# Patient Record
Sex: Male | Born: 1975 | Race: White | Hispanic: No | Marital: Married
Health system: Southern US, Community
[De-identification: ages and names within clinical notes are randomized; demographics above are authoritative.]

## PROBLEM LIST (undated history)

## (undated) DIAGNOSIS — Z789 Other specified health status: Secondary | ICD-10-CM

---

## 2018-03-07 ENCOUNTER — Other Ambulatory Visit: Payer: Self-pay | Admitting: Family Medicine

## 2018-03-07 DIAGNOSIS — R1011 Right upper quadrant pain: Secondary | ICD-10-CM

## 2018-03-14 ENCOUNTER — Ambulatory Visit
Admission: RE | Admit: 2018-03-14 | Discharge: 2018-03-14 | Disposition: A | Discharge status: Home / Self Care | Payer: BC Managed Care – PPO | Source: Ambulatory Visit | Attending: Family Medicine | Admitting: Family Medicine

## 2018-03-14 DIAGNOSIS — R1011 Right upper quadrant pain: Secondary | ICD-10-CM | POA: Insufficient documentation

## 2018-05-16 ENCOUNTER — Ambulatory Visit: Payer: BC Managed Care – PPO | Admitting: Anesthesiology

## 2018-05-16 ENCOUNTER — Ambulatory Visit
Admission: RE | Admit: 2018-05-16 | Discharge: 2018-05-16 | Disposition: A | Discharge status: Home / Self Care | Payer: BC Managed Care – PPO | Source: Ambulatory Visit | Attending: Gastroenterology | Admitting: Gastroenterology

## 2018-05-16 ENCOUNTER — Encounter
Admission: RE | Disposition: A | Discharge status: Home / Self Care | Payer: Self-pay | Source: Ambulatory Visit | Attending: Gastroenterology

## 2018-05-16 ENCOUNTER — Encounter: Payer: Self-pay | Admitting: Anesthesiology

## 2018-05-16 DIAGNOSIS — Z79899 Other long term (current) drug therapy: Secondary | ICD-10-CM | POA: Insufficient documentation

## 2018-05-16 DIAGNOSIS — K2980 Duodenitis without bleeding: Secondary | ICD-10-CM | POA: Insufficient documentation

## 2018-05-16 DIAGNOSIS — K2960 Other gastritis without bleeding: Secondary | ICD-10-CM | POA: Insufficient documentation

## 2018-05-16 DIAGNOSIS — K2210 Ulcer of esophagus without bleeding: Secondary | ICD-10-CM | POA: Diagnosis not present

## 2018-05-16 DIAGNOSIS — K219 Gastro-esophageal reflux disease without esophagitis: Secondary | ICD-10-CM | POA: Insufficient documentation

## 2018-05-16 DIAGNOSIS — K449 Diaphragmatic hernia without obstruction or gangrene: Secondary | ICD-10-CM | POA: Diagnosis not present

## 2018-05-16 DIAGNOSIS — R1011 Right upper quadrant pain: Secondary | ICD-10-CM | POA: Diagnosis present

## 2018-05-16 HISTORY — PX: ESOPHAGOGASTRODUODENOSCOPY (EGD) WITH PROPOFOL: SHX5813

## 2018-05-16 SURGERY — ESOPHAGOGASTRODUODENOSCOPY (EGD) WITH PROPOFOL
Anesthesia: General

## 2018-05-16 MED ORDER — PROPOFOL 500 MG/50ML IV EMUL
INTRAVENOUS | Status: AC
  Filled 2018-05-16: qty 50

## 2018-05-16 MED ORDER — PROPOFOL 500 MG/50ML IV EMUL
INTRAVENOUS | Status: DC | PRN
  Administered 2018-05-16: 120 ug/kg/min via INTRAVENOUS

## 2018-05-16 MED ORDER — MIDAZOLAM HCL 2 MG/2ML IJ SOLN
INTRAMUSCULAR | Status: DC | PRN
  Administered 2018-05-16: 2 mg via INTRAVENOUS

## 2018-05-16 MED ORDER — GLYCOPYRROLATE 0.2 MG/ML IJ SOLN
INTRAMUSCULAR | Status: AC
  Filled 2018-05-16: qty 1

## 2018-05-16 MED ORDER — LIDOCAINE HCL (CARDIAC) PF 100 MG/5ML IV SOSY
PREFILLED_SYRINGE | INTRAVENOUS | Status: DC | PRN
  Administered 2018-05-16: 30 mg via INTRAVENOUS

## 2018-05-16 MED ORDER — LIDOCAINE HCL (PF) 1 % IJ SOLN
2.0000 mL | Freq: Once | INTRAMUSCULAR | Status: AC
  Administered 2018-05-16: 0.3 mL via INTRADERMAL

## 2018-05-16 MED ORDER — SODIUM CHLORIDE 0.9 % IV SOLN
INTRAVENOUS | Status: DC
  Administered 2018-05-16: 1000 mL via INTRAVENOUS

## 2018-05-16 MED ORDER — MIDAZOLAM HCL 2 MG/2ML IJ SOLN
INTRAMUSCULAR | Status: AC
  Filled 2018-05-16: qty 2

## 2018-05-16 MED ORDER — GLYCOPYRROLATE 0.2 MG/ML IJ SOLN
INTRAMUSCULAR | Status: DC | PRN
  Administered 2018-05-16: 0.1 mg via INTRAVENOUS

## 2018-05-16 MED ORDER — LIDOCAINE HCL (PF) 2 % IJ SOLN
INTRAMUSCULAR | Status: AC
  Filled 2018-05-16: qty 10

## 2018-05-16 MED ORDER — SODIUM CHLORIDE 0.9 % IV SOLN: INTRAVENOUS | Status: DC

## 2018-05-16 MED ORDER — LIDOCAINE HCL (PF) 1 % IJ SOLN
INTRAMUSCULAR | Status: AC
  Administered 2018-05-16: 0.3 mL via INTRADERMAL
  Filled 2018-05-16: qty 2

## 2018-05-16 MED ORDER — FENTANYL CITRATE (PF) 100 MCG/2ML IJ SOLN
INTRAMUSCULAR | Status: DC | PRN
  Administered 2018-05-16: 25 ug via INTRAVENOUS
  Administered 2018-05-16: 50 ug via INTRAVENOUS
  Administered 2018-05-16: 25 ug via INTRAVENOUS

## 2018-05-16 MED ORDER — FENTANYL CITRATE (PF) 100 MCG/2ML IJ SOLN
INTRAMUSCULAR | Status: AC
  Filled 2018-05-16: qty 2

## 2018-05-16 NOTE — Anesthesia Post-op Follow-up Note (Signed)
Anesthesia QCDR form completed.        

## 2018-05-16 NOTE — Anesthesia Procedure Notes (Signed)
Performed by: Cook-Martin, Accalia Rigdon Pre-anesthesia Checklist: Patient identified, Emergency Drugs available, Suction available, Patient being monitored and Timeout performed Patient Re-evaluated:Patient Re-evaluated prior to induction Oxygen Delivery Method: Nasal cannula Preoxygenation: Pre-oxygenation with 100% oxygen Induction Type: IV induction Placement Confirmation: positive ETCO2 and CO2 detector       

## 2018-05-16 NOTE — H&P (Signed)
Outpatient short stay form Pre-procedure 05/16/2018 9:45 AM [name] MD  Primary Physician: Dr. Thereasa Quinn  Reason for visit: EGD  History of present illness: Patient is a 42 year old male presenting today for an EGD in regards to right upper quadrant pain.  This is been occurring near daily for at least a year or so.  Has been started on a proton pump inhibitor about 4 weeks or so ago.  He states that seems to help but he takes it irregularly.  No nausea or vomiting with this.  There does not seem to be any radiation.  It does not seem to be a trigger food.  Seen no blood in the stool.  He does at times have diarrhea following a meal. Has had a abdominal ultrasound that was normal has not had a HIDA CCK study.   Current Facility-Administered Medications:  .  0.9 %  sodium chloride infusion, , Intravenous, Continuous, [name], MD, Last Rate: 20 mL/hr at 05/16/18 0916, 1,000 mL at 05/16/18 0916 .  0.9 %  sodium chloride infusion, , Intravenous, Continuous, [name], MD  Medications Prior to Admission  Medication Sig Dispense Refill Last Dose  . pantoprazole (PROTONIX) 40 MG tablet Take 40 mg by mouth daily.        No Known Allergies   History reviewed. No pertinent past medical history.  Review of systems:      Physical Exam    Heart and lungs: Regular rate and rhythm without rub or gallop, lungs are bilaterally clear.    HEENT: Cephalic atraumatic eyes are anicteric    Other:    Pertinant exam for procedure: Soft nontender nondistended bowel sounds positive normoactive    Planned proceedures: EGD and indicated procedures. I have discussed the risks benefits and complications of procedures to include not limited to bleeding, infection, perforation and the risk of sedation and the patient wishes to proceed.    [name], MD Gastroenterology 05/16/2018  9:45 AM

## 2018-05-16 NOTE — Op Note (Addendum)
[facility] Gastroenterology Patient Name: Jimmy Quinn Procedure Date: 05/16/2018 9:35 AM MRN: [id number] Account #: [id number] Date of Birth: 1975/11/26 Admit Type: Outpatient Age: 42 Room: [facility] ENDO ROOM 3 Gender: Male Note Status: Finalized Procedure:            Upper GI endoscopy Indications:          Abdominal pain in the right upper quadrant Providers:            [name], MD Medicines:            Monitored Anesthesia Care Complications:        No immediate complications. Procedure:            Pre-Anesthesia Assessment:                       - ASA Grade Assessment: II - A patient with mild                        systemic disease.                       After obtaining informed consent, the endoscope was                        passed under direct vision. Throughout the procedure,                        the patient's blood pressure, pulse, and oxygen                        saturations were monitored continuously. The Endoscope                        was introduced through the mouth, and advanced to the                        third part of duodenum. The upper GI endoscopy was                        accomplished without difficulty. The patient tolerated                        the procedure well. Findings:      LA Grade B (one or more mucosal breaks greater than 5 mm, not extending       between the tops of two mucosal folds) esophagitis with no bleeding was       found. Biopsies were taken with a cold forceps for histology.      Patchy mild inflammation characterized by congestion (edema), erosions       and erythema was found in the gastric body and in the gastric antrum.       Biopsies were taken with a cold forceps for histology. Biopsies were       taken with a cold forceps for Helicobacter pylori testing.      A small hiatal hernia was found. The Z-line was a variable distance from       incisors; the hiatal hernia was sliding.      Moderate  inflammation characterized by congestion (edema), erosions,       erythema and granularity was found in the duodenal bulb. Biopsies were  taken with a cold forceps for histology.      The exam of the duodenum was otherwise normal.      The cardia and gastric fundus were normal on retroflexion otherwise. Impression:           - LA Grade B erosive esophagitis. Biopsied.                       - Erosive gastritis. Biopsied.                       - Small hiatal hernia.                       - Erosive duodenitis. Biopsied. Recommendation:       - Discharge patient to home.                       - Use Protonix (pantoprazole) 40 mg PO BID for 6 weeks.                       - Use Protonix (pantoprazole) 40 mg PO daily daily.                       - Perform a HIDA (hepatobiliary iminodiacetic acid)                        scan with CCK stimulation at appointment to be                        scheduled. Procedure Code(s):    --- Professional ---                       [phone], Esophagogastroduodenoscopy, flexible, transoral;                        with biopsy, single or multiple Diagnosis Code(s):    --- Professional ---                       K20.8, Other esophagitis                       K29.60, Other gastritis without bleeding                       K44.9, Diaphragmatic hernia without obstruction or                        gangrene                       K29.80, Duodenitis without bleeding                       R10.11, Right upper quadrant pain CPT copyright 2018 American Medical Association. All rights reserved. The codes documented in this report are preliminary and upon coder review may  be revised to meet current compliance requirements. [name], MD 05/16/2018 10:10:09 AM This report has been signed electronically. Number of Addenda: 0 Note Initiated On: 05/16/2018 9:35 AM      [facility]

## 2018-05-16 NOTE — Transfer of Care (Signed)
Immediate Anesthesia Transfer of Care Note  Patient: Jimmy Quinn  Procedure(s) Performed: ESOPHAGOGASTRODUODENOSCOPY (EGD) WITH PROPOFOL (N/A )  Patient Location: PACU  Anesthesia Type:General  Level of Consciousness: awake and sedated  Airway & Oxygen Therapy: Patient Spontanous Breathing and Patient connected to nasal cannula oxygen  Post-op Assessment: Report given to RN and Post -op Vital signs reviewed and stable  Post vital signs: Reviewed  Last Vitals:  Vitals Value Taken Time  BP    Temp    Pulse    Resp    SpO2      Last Pain:  Vitals:   05/16/18 0900  TempSrc: Tympanic  PainSc: 0-No pain         Complications: No apparent anesthesia complications

## 2018-05-16 NOTE — Anesthesia Postprocedure Evaluation (Signed)
Anesthesia Post Note  Patient: OLUWADAMILARE TOBLER  Procedure(s) Performed: ESOPHAGOGASTRODUODENOSCOPY (EGD) WITH PROPOFOL (N/A )  Patient location during evaluation: Endoscopy Anesthesia Type: General Level of consciousness: awake and alert and oriented Pain management: pain level controlled Vital Signs Assessment: post-procedure vital signs reviewed and stable Respiratory status: spontaneous breathing, nonlabored ventilation and respiratory function stable Cardiovascular status: blood pressure returned to baseline and stable Postop Assessment: no signs of nausea or vomiting Anesthetic complications: no     Last Vitals:  Vitals:   05/16/18 0900 05/16/18 1012  BP: (!) 144/86 102/73  Pulse: (!) 50   Resp: 17   Temp: 36.5 C (!) 36.1 C  SpO2: 97%     Last Pain:  Vitals:   05/16/18 1048  TempSrc:   PainSc: 0-No pain                 Aanika Defoor

## 2018-05-16 NOTE — Anesthesia Preprocedure Evaluation (Signed)
Anesthesia Evaluation  Patient identified by MRN, date of birth, ID band Patient awake    Reviewed: Allergy & Precautions, NPO status , Patient's Chart, lab work & pertinent test results  History of Anesthesia Complications Negative for: history of anesthetic complications  Airway Mallampati: II  TM Distance: >3 FB Neck ROM: Full    Dental  (+) Poor Dentition   Pulmonary neg COPD, Current Smoker,    breath sounds clear to auscultation- rhonchi (-) wheezing      Cardiovascular Exercise Tolerance: Good (-) hypertension(-) CAD, (-) Past MI, (-) Cardiac Stents and (-) CABG  Rhythm:Regular Rate:Normal - Systolic murmurs and - Diastolic murmurs    Neuro/Psych negative neurological ROS  negative psych ROS   GI/Hepatic Neg liver ROS, GERD  ,  Endo/Other  negative endocrine ROSneg diabetes  Renal/GU negative Renal ROS     Musculoskeletal negative musculoskeletal ROS (+)   Abdominal (+) - obese,   Peds  Hematology negative hematology ROS (+)   Anesthesia Other Findings    Reproductive/Obstetrics                             Anesthesia Physical Anesthesia Plan  ASA: II  Anesthesia Plan: General   Post-op Pain Management:    Induction: Intravenous  PONV Risk Score and Plan: 1 and Propofol infusion  Airway Management Planned: Natural Airway  Additional Equipment:   Intra-op Plan:   Post-operative Plan:   Informed Consent: I have reviewed the patients History and Physical, chart, labs and discussed the procedure including the risks, benefits and alternatives for the proposed anesthesia with the patient or authorized representative who has indicated his/her understanding and acceptance.   Dental advisory given  Plan Discussed with: CRNA and Anesthesiologist  Anesthesia Plan Comments:         Anesthesia Quick Evaluation

## 2018-05-17 LAB — SURGICAL PATHOLOGY

## 2018-05-20 ENCOUNTER — Other Ambulatory Visit: Payer: Self-pay | Admitting: Gastroenterology

## 2018-05-20 ENCOUNTER — Encounter: Payer: Self-pay | Admitting: Gastroenterology

## 2018-05-20 DIAGNOSIS — R1011 Right upper quadrant pain: Secondary | ICD-10-CM

## 2018-05-31 ENCOUNTER — Ambulatory Visit
Admission: RE | Admit: 2018-05-31 | Discharge: 2018-05-31 | Disposition: A | Discharge status: Home / Self Care | Payer: BC Managed Care – PPO | Source: Ambulatory Visit | Attending: Gastroenterology | Admitting: Gastroenterology

## 2018-05-31 DIAGNOSIS — R1011 Right upper quadrant pain: Secondary | ICD-10-CM

## 2018-05-31 MED ORDER — TECHNETIUM TC 99M MEBROFENIN IV KIT
5.3690 | PACK | Freq: Once | INTRAVENOUS | Status: AC | PRN
  Administered 2018-05-31: 5.369 via INTRAVENOUS

## 2019-03-22 IMAGING — NM NM HEPATO W/GB/PHARM/[PERSON_NAME]
2 series · 12 of 12 positions shown · non-contrast
Comparison: None.

CLINICAL DATA: Right upper quadrant pain.

EXAM:
NUCLEAR MEDICINE HEPATOBILIARY IMAGING WITH GALLBLADDER EF
TECHNIQUE: Sequential images of the abdomen were obtained [DATE] minutes
following intravenous administration of radiopharmaceutical. After
oral ingestion of Ensure, gallbladder ejection fraction was
determined. At 60 min, normal ejection fraction is greater than 33%.
RADIOPHARMACEUTICALS:  5.369 mCi Ic-88m  Choletec IV

[Series 1000: hepatobiliary scan · 9.59mm/px · 6 of 60 frames shown]
[frame 6/60]
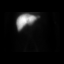
[frame 16/60]
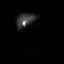
[frame 26/60]
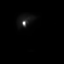
[frame 36/60]
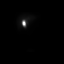
[frame 46/60]
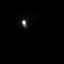
[frame 56/60]
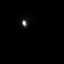

[Series 1000: gallbladder ef · 4.80mm/px · 6 of 120 frames shown]
[frame 11/120]
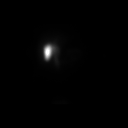
[frame 31/120]
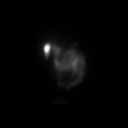
[frame 51/120]
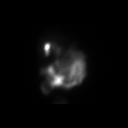
[frame 71/120]
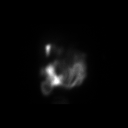
[frame 91/120]
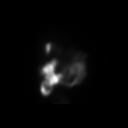
[frame 111/120]
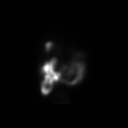

[12 of 12 positions shown; findings below may reference images not displayed]

FINDINGS: Prompt uptake and biliary excretion of activity by the liver is
seen. Gallbladder activity is visualized, consistent with patency of
cystic duct. Biliary activity passes into small bowel, consistent
with patent common bile duct.

Calculated gallbladder ejection fraction is 94%. (Normal gallbladder
ejection fraction with Ensure is greater than 33%.)
IMPRESSION: Normal study.  Normal gallbladder ejection fraction.

## 2020-09-11 IMAGING — US US ABDOMEN LIMITED
1 series · 14 of 25 positions shown · non-contrast
Comparison: None.

CLINICAL DATA: Intermittent right upper quadrant abdominal pain for
the past year

EXAM:
ULTRASOUND ABDOMEN LIMITED RIGHT UPPER QUADRANT

[Series 1: us abdomen limited · 0.18mm/px · 14 of 54 slices shown]
[im 1/54]
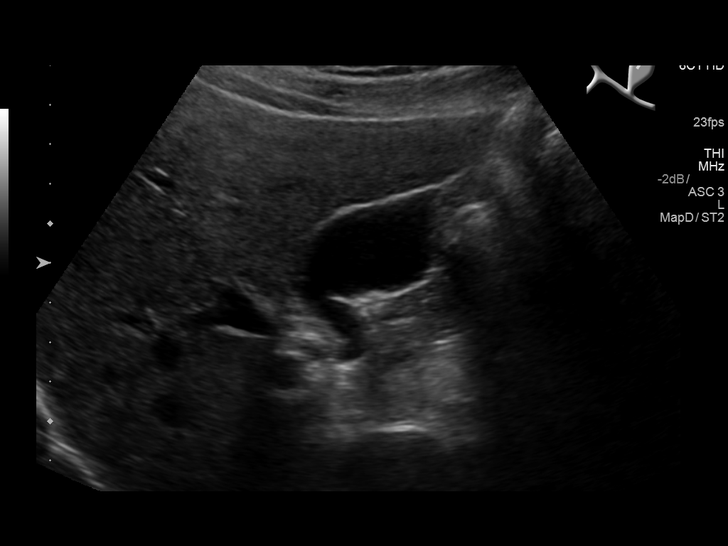
[im 5/54]
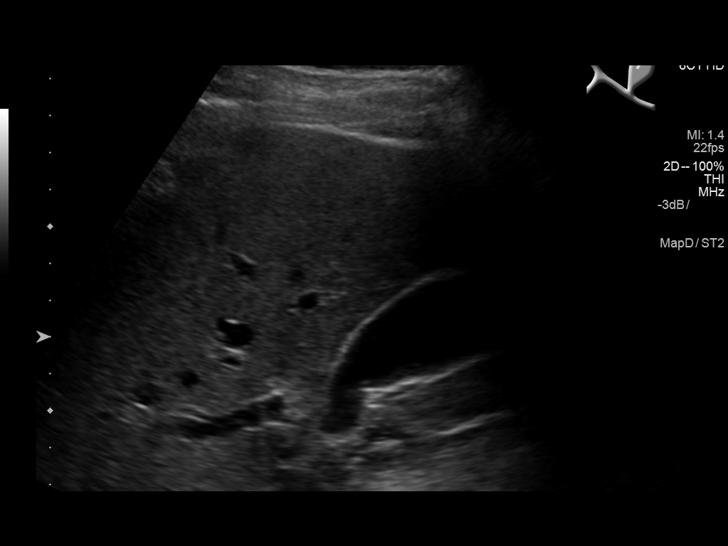
[im 9/54]
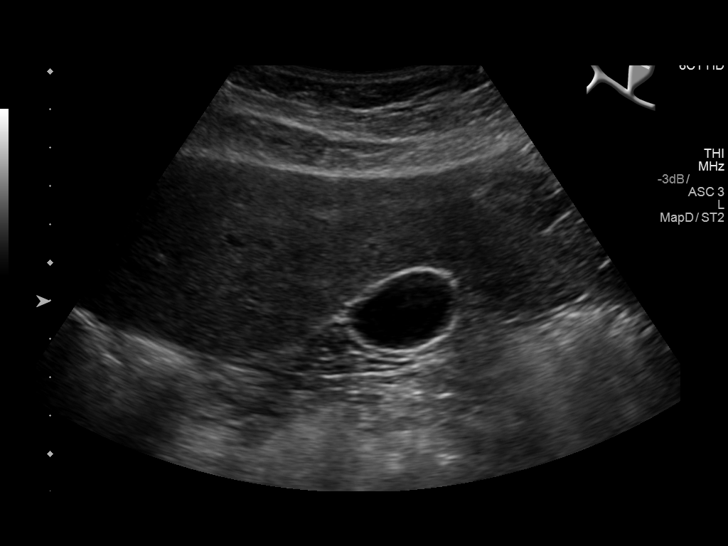
[im 14/54]
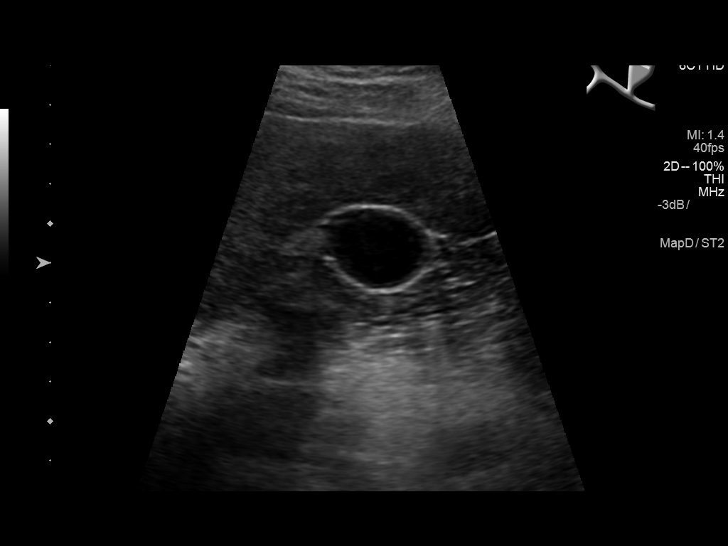
[im 18/54]
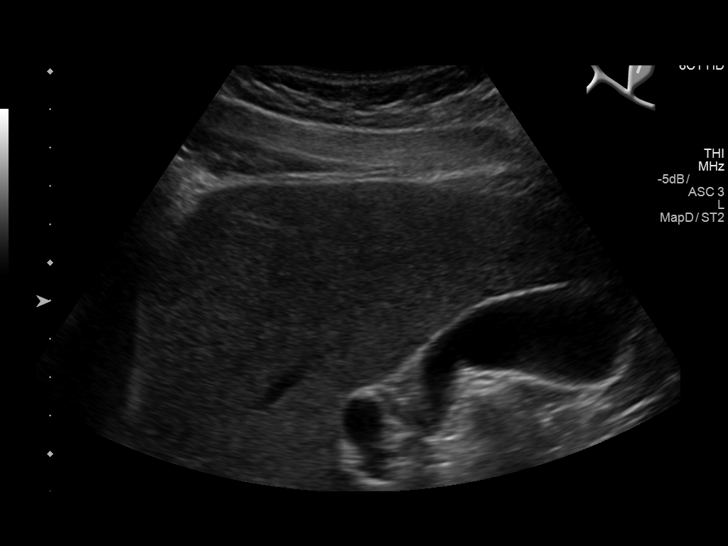
[im 20/54]
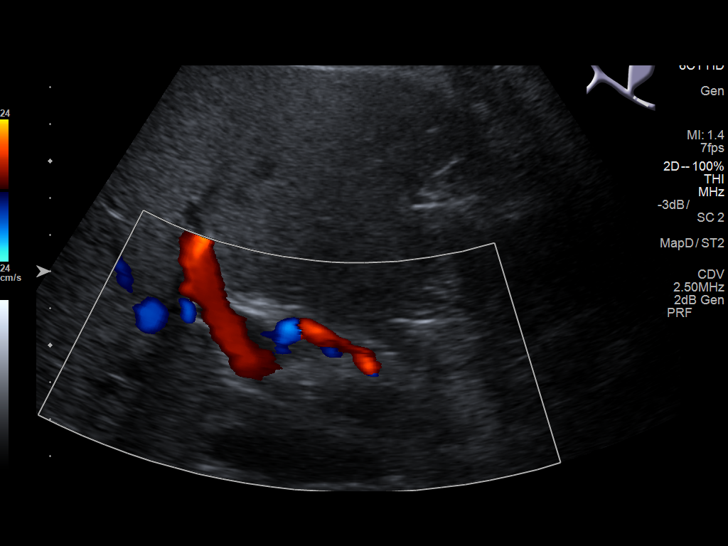
[im 25/54]
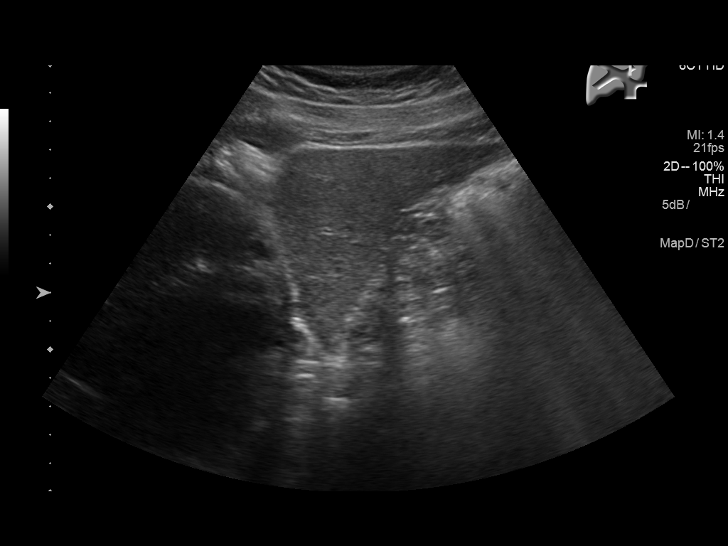
[im 29/54]
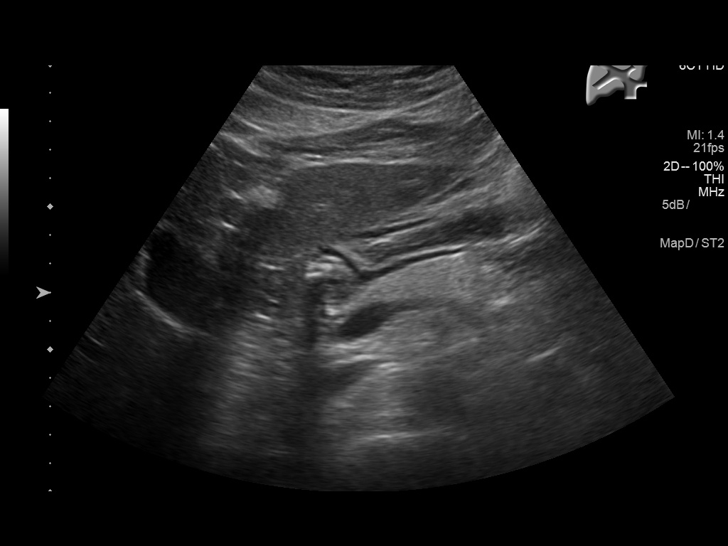
[im 34/54]
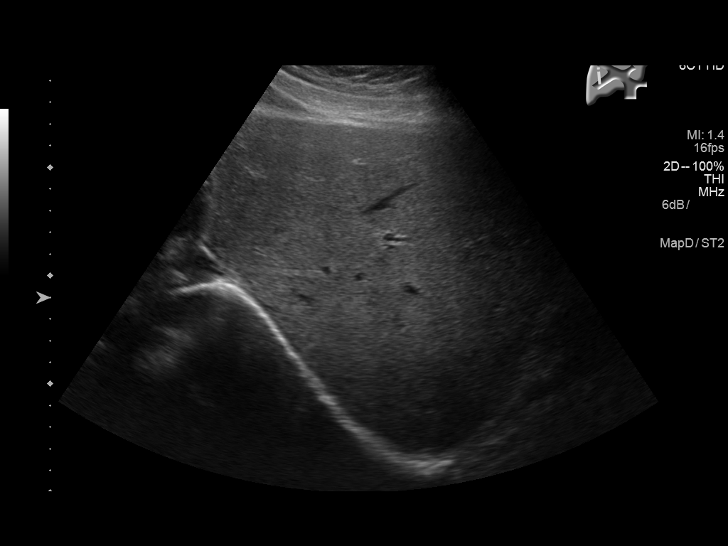
[im 36/54]
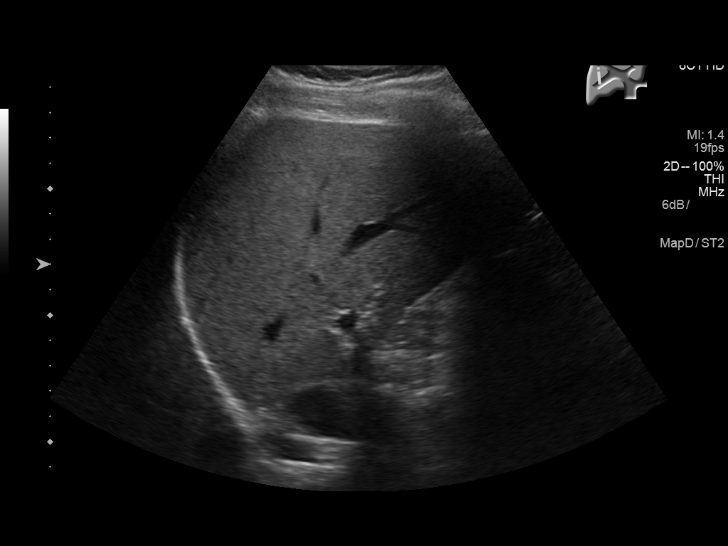
[im 40/54]
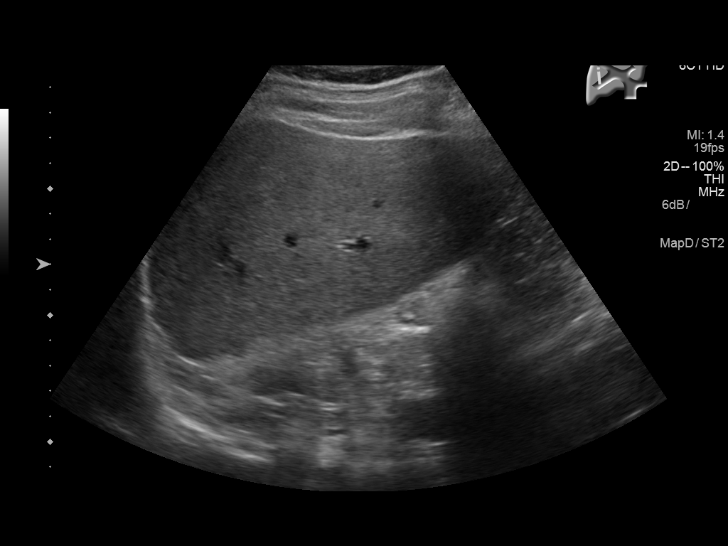
[im 45/54]
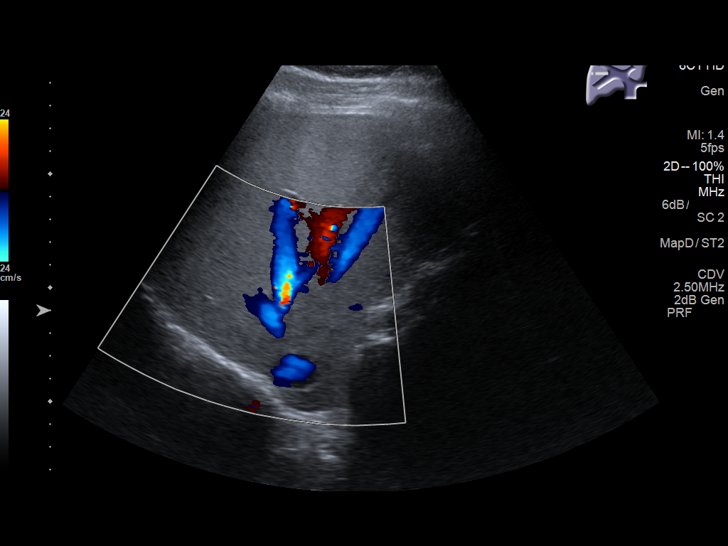
[im 49/54]
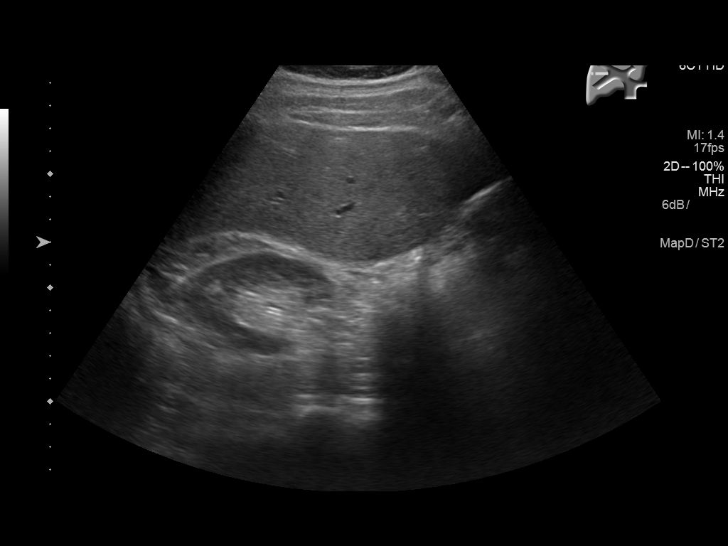
[im 54/54]
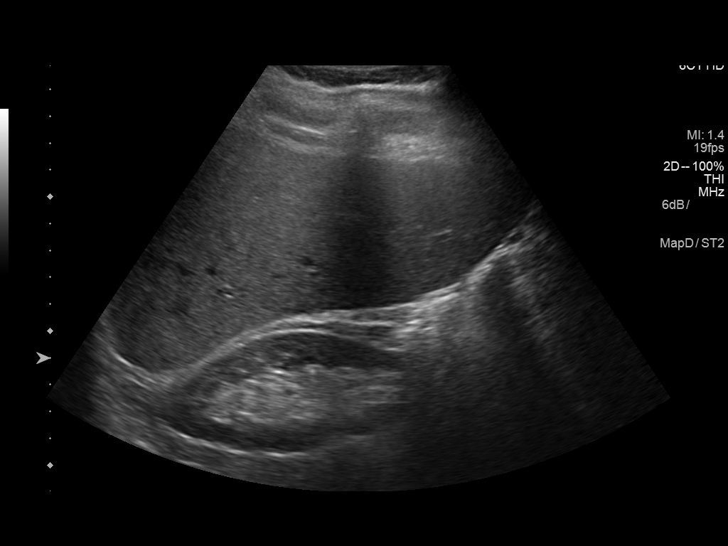

[14 of 25 positions shown; findings below may reference images not displayed]

FINDINGS: Gallbladder:

No gallstones or wall thickening visualized. No sonographic Murphy
sign noted by sonographer.

Common bile duct:

Diameter: 3.1 mm

Liver:

No focal lesion identified. Within normal limits in parenchymal
echogenicity. Portal vein is patent on color Doppler imaging with
normal direction of blood flow towards the liver.
IMPRESSION: Normal right upper quadrant abdominal ultrasound examination.

## 2022-05-05 ENCOUNTER — Encounter
Admission: RE | Disposition: A | Discharge status: Home / Self Care | Payer: Self-pay | Source: Home / Self Care | Attending: Gastroenterology

## 2022-05-05 ENCOUNTER — Ambulatory Visit: Payer: 59 | Admitting: Anesthesiology

## 2022-05-05 ENCOUNTER — Ambulatory Visit
Admission: RE | Admit: 2022-05-05 | Discharge: 2022-05-05 | Disposition: A | Discharge status: Home / Self Care | Payer: 59 | Attending: Gastroenterology | Admitting: Gastroenterology

## 2022-05-05 ENCOUNTER — Encounter: Payer: Self-pay | Admitting: *Deleted

## 2022-05-05 ENCOUNTER — Other Ambulatory Visit: Payer: Self-pay

## 2022-05-05 DIAGNOSIS — D122 Benign neoplasm of ascending colon: Secondary | ICD-10-CM | POA: Diagnosis not present

## 2022-05-05 DIAGNOSIS — K640 First degree hemorrhoids: Secondary | ICD-10-CM | POA: Insufficient documentation

## 2022-05-05 DIAGNOSIS — K219 Gastro-esophageal reflux disease without esophagitis: Secondary | ICD-10-CM | POA: Diagnosis not present

## 2022-05-05 DIAGNOSIS — Z1211 Encounter for screening for malignant neoplasm of colon: Secondary | ICD-10-CM | POA: Diagnosis present

## 2022-05-05 HISTORY — DX: Other specified health status: Z78.9

## 2022-05-05 HISTORY — PX: COLONOSCOPY WITH PROPOFOL: SHX5780

## 2022-05-05 SURGERY — COLONOSCOPY WITH PROPOFOL
Anesthesia: General

## 2022-05-05 MED ORDER — PROPOFOL 10 MG/ML IV BOLUS
INTRAVENOUS | Status: DC | PRN
  Administered 2022-05-05: 60 mg via INTRAVENOUS
  Administered 2022-05-05: 20 mg via INTRAVENOUS
  Administered 2022-05-05: 80 mg via INTRAVENOUS

## 2022-05-05 MED ORDER — PROPOFOL 500 MG/50ML IV EMUL
INTRAVENOUS | Status: DC | PRN
  Administered 2022-05-05: 150 ug/kg/min via INTRAVENOUS

## 2022-05-05 MED ORDER — DEXMEDETOMIDINE HCL IN NACL 80 MCG/20ML IV SOLN
INTRAVENOUS | Status: DC | PRN
  Administered 2022-05-05: 8 ug via BUCCAL
  Administered 2022-05-05: 4 ug via BUCCAL

## 2022-05-05 MED ORDER — LIDOCAINE 2% (20 MG/ML) 5 ML SYRINGE
INTRAMUSCULAR | Status: DC | PRN
  Administered 2022-05-05: 50 mg via INTRAVENOUS

## 2022-05-05 MED ORDER — SODIUM CHLORIDE 0.9 % IV SOLN: INTRAVENOUS | Status: DC

## 2022-05-05 NOTE — H&P (Signed)
Outpatient short stay form Pre-procedure 05/05/2022  Lesly Rubenstein, MD  Primary Physician: Sofie Hartigan, MD  Reason for visit:  Screening colonoscopy  History of present illness:    46 y/o gentleman with history of GERD here for index screening colonoscopy. No blood thinners. No family history of GI malignancies. No significant abdominal surgeries.    Current Facility-Administered Medications:    0.9 %  sodium chloride infusion, , Intravenous, Continuous, Nicola Heinemann, Hilton Cork, MD, Last Rate: 20 mL/hr at 05/05/22 1257, Continued from Pre-op at 05/05/22 1257  Medications Prior to Admission  Medication Sig Dispense Refill Last Dose   pantoprazole (PROTONIX) 40 MG tablet Take 40 mg by mouth daily. (Patient not taking: Reported on 05/05/2022)   Not Taking     No Known Allergies   Past Medical History:  Diagnosis Date   Medical history non-contributory     Review of systems:  Otherwise negative.    Physical Exam  Gen: Alert, oriented. Appears stated age.  HEENT: PERRLA. Lungs: No respiratory distress CV: RRR Abd: soft, benign, no masses Ext: No edema    Planned procedures: Proceed with colonoscopy. The patient understands the nature of the planned procedure, indications, risks, alternatives and potential complications including but not limited to bleeding, infection, perforation, damage to internal organs and possible oversedation/side effects from anesthesia. The patient agrees and gives consent to proceed.  Please refer to procedure notes for findings, recommendations and patient disposition/instructions.     Lesly Rubenstein, MD [facility] Gastroenterology

## 2022-05-05 NOTE — Interval H&P Note (Signed)
History and Physical Interval Note:  05/05/2022 12:59 PM  Stephani Police  has presented today for surgery, with the diagnosis of Colon Cancer screening.  The various methods of treatment have been discussed with the patient and family. After consideration of risks, benefits and other options for treatment, the patient has consented to  Procedure(s): COLONOSCOPY WITH PROPOFOL (N/A) as a surgical intervention.  The patient's history has been reviewed, patient examined, no change in status, stable for surgery.  I have reviewed the patient's chart and labs.  Questions were answered to the patient's satisfaction.     Lesly Rubenstein  Ok to proceed with colonoscopy

## 2022-05-05 NOTE — Transfer of Care (Signed)
Immediate Anesthesia Transfer of Care Note  Patient: Jimmy Quinn  Procedure(s) Performed: COLONOSCOPY WITH PROPOFOL  Patient Location: Endoscopy Unit  Anesthesia Type:General  Level of Consciousness: drowsy  Airway & Oxygen Therapy: Patient Spontanous Breathing  Post-op Assessment: Report given to RN and Post -op Vital signs reviewed and stable  Post vital signs: Reviewed and stable  Last Vitals:  Vitals Value Taken Time  BP    Temp    Pulse    Resp 23 05/05/22 1335  SpO2    Vitals shown include unvalidated device data.  Last Pain:  Vitals:   05/05/22 1332  TempSrc: Temporal  PainSc:          Complications: No notable events documented.

## 2022-05-05 NOTE — Anesthesia Preprocedure Evaluation (Signed)
Anesthesia Evaluation  Patient identified by MRN, date of birth, ID band Patient awake    Reviewed: Allergy & Precautions, H&P , NPO status , Patient's Chart, lab work & pertinent test results, reviewed documented beta blocker date and time   Airway Mallampati: II   Neck ROM: full    Dental  (+) Poor Dentition   Pulmonary neg pulmonary ROS   Pulmonary exam normal        Cardiovascular negative cardio ROS Normal cardiovascular exam Rhythm:regular Rate:Normal     Neuro/Psych negative neurological ROS  negative psych ROS   GI/Hepatic negative GI ROS, Neg liver ROS,,,  Endo/Other  negative endocrine ROS    Renal/GU negative Renal ROS  negative genitourinary   Musculoskeletal   Abdominal   Peds  Hematology negative hematology ROS (+)   Anesthesia Other Findings No past medical history on file. Past Surgical History: 05/16/2018: ESOPHAGOGASTRODUODENOSCOPY (EGD) WITH PROPOFOL; N/A     Comment:  Procedure: ESOPHAGOGASTRODUODENOSCOPY (EGD) WITH               PROPOFOL;  Surgeon: [name], MD;  Location:               [facility] ENDOSCOPY;  Service: Endoscopy;  Laterality: N/A;   Reproductive/Obstetrics negative OB ROS                             Anesthesia Physical Anesthesia Plan  ASA: 2  Anesthesia Plan: General   Post-op Pain Management:    Induction:   PONV Risk Score and Plan:   Airway Management Planned:   Additional Equipment:   Intra-op Plan:   Post-operative Plan:   Informed Consent: I have reviewed the patients History and Physical, chart, labs and discussed the procedure including the risks, benefits and alternatives for the proposed anesthesia with the patient or authorized representative who has indicated his/her understanding and acceptance.     Dental Advisory Given  Plan Discussed with: CRNA  Anesthesia Plan Comments:        Anesthesia Quick  Evaluation

## 2022-05-05 NOTE — Op Note (Signed)
[facility] Gastroenterology Patient Name: Jimmy Quinn Procedure Date: 05/05/2022 12:57 PM MRN: [id number] Account #: [id number] Date of Birth: July 06, 1975 Admit Type: Outpatient Age: 46 Room: [facility] ENDO ROOM 1 Gender: Male Note Status: Finalized Instrument Name: Jasper Riling [id number] Procedure:             Colonoscopy Indications:           Screening for colorectal malignant neoplasm Providers:             Andrey Farmer MD, MD Medicines:             Monitored Anesthesia Care Complications:         No immediate complications. Estimated blood loss:                         Minimal. Procedure:             Pre-Anesthesia Assessment:                        - Prior to the procedure, a History and Physical was                         performed, and patient medications and allergies were                         reviewed. The patient is competent. The risks and                         benefits of the procedure and the sedation options and                         risks were discussed with the patient. All questions                         were answered and informed consent was obtained.                         Patient identification and proposed procedure were                         verified by the physician, the nurse, the                         anesthesiologist, the anesthetist and the technician                         in the endoscopy suite. Mental Status Examination:                         alert and oriented. Airway Examination: normal                         oropharyngeal airway and neck mobility. Respiratory                         Examination: clear to auscultation. CV Examination:                         normal. Prophylactic Antibiotics: The patient does not  require prophylactic antibiotics. Prior                         Anticoagulants: The patient has taken no anticoagulant                         or antiplatelet agents. ASA Grade  Assessment: II - A                         patient with mild systemic disease. After reviewing                         the risks and benefits, the patient was deemed in                         satisfactory condition to undergo the procedure. The                         anesthesia plan was to use monitored anesthesia care                         (MAC). Immediately prior to administration of                         medications, the patient was re-assessed for adequacy                         to receive sedatives. The heart rate, respiratory                         rate, oxygen saturations, blood pressure, adequacy of                         pulmonary ventilation, and response to care were                         monitored throughout the procedure. The physical                         status of the patient was re-assessed after the                         procedure.                        After obtaining informed consent, the colonoscope was                         passed under direct vision. Throughout the procedure,                         the patient's blood pressure, pulse, and oxygen                         saturations were monitored continuously. The                         Colonoscope was introduced through the anus and  advanced to the the cecum, identified by appendiceal                         orifice and ileocecal valve. The colonoscopy was                         performed without difficulty. The patient tolerated                         the procedure well. The quality of the bowel                         preparation was adequate to identify polyps. The                         ileocecal valve, appendiceal orifice, and rectum were                         photographed. Findings:      The perianal and digital rectal examinations were normal.      A 3 mm polyp was found in the ascending colon. The polyp was sessile.       The polyp was removed with a cold  snare. Resection and retrieval were       complete. Estimated blood loss was minimal.      Internal hemorrhoids were found during retroflexion. The hemorrhoids       were Grade I (internal hemorrhoids that do not prolapse).      The exam was otherwise without abnormality on direct and retroflexion       views. Impression:            - One 3 mm polyp in the ascending colon, removed with                         a cold snare. Resected and retrieved.                        - Internal hemorrhoids.                        - The examination was otherwise normal on direct and                         retroflexion views. Recommendation:        - Discharge patient to home.                        - Resume previous diet.                        - Continue present medications.                        - Await pathology results.                        - Repeat colonoscopy in 7 years for surveillance.                        - Return to referring physician as previously  scheduled. Procedure Code(s):     --- Professional ---                        [phone], Colonoscopy, flexible; with removal of                         tumor(s), polyp(s), or other lesion(s) by snare                         technique Diagnosis Code(s):     --- Professional ---                        Z12.11, Encounter for screening for malignant neoplasm                         of colon                        D12.2, Benign neoplasm of ascending colon                        K64.0, First degree hemorrhoids CPT copyright 2022 American Medical Association. All rights reserved. The codes documented in this report are preliminary and upon coder review may  be revised to meet current compliance requirements. Andrey Farmer MD, MD 05/05/2022 1:33:21 PM Number of Addenda: 0 Note Initiated On: 05/05/2022 12:57 PM Scope Withdrawal Time: 0 hours 9 minutes 44 seconds  Total Procedure Duration: 0 hours 13 minutes 49 seconds   Estimated Blood Loss:  Estimated blood loss was minimal.      [facility]

## 2022-05-06 ENCOUNTER — Encounter: Payer: Self-pay | Admitting: Gastroenterology

## 2022-05-06 LAB — SURGICAL PATHOLOGY

## 2022-05-06 NOTE — Anesthesia Postprocedure Evaluation (Signed)
Anesthesia Post Note  Patient: Jimmy Quinn  Procedure(s) Performed: COLONOSCOPY WITH PROPOFOL  Patient location during evaluation: PACU Anesthesia Type: General Level of consciousness: awake and alert Pain management: pain level controlled Vital Signs Assessment: post-procedure vital signs reviewed and stable Respiratory status: spontaneous breathing, nonlabored ventilation, respiratory function stable and patient connected to nasal cannula oxygen Cardiovascular status: blood pressure returned to baseline and stable Postop Assessment: no apparent nausea or vomiting Anesthetic complications: no   No notable events documented.   Last Vitals:  Vitals:   05/05/22 1342 05/05/22 1352  BP: 103/68 118/78  Pulse: (!) 50   Resp: 12 16  Temp:    SpO2: 97%     Last Pain:  Vitals:   05/06/22 0745  TempSrc:   PainSc: 0-No pain                 Molli Barrows

## 2023-09-23 ENCOUNTER — Other Ambulatory Visit: Payer: Self-pay | Admitting: Cardiology

## 2023-09-23 DIAGNOSIS — Z8249 Family history of ischemic heart disease and other diseases of the circulatory system: Secondary | ICD-10-CM

## 2023-10-06 ENCOUNTER — Ambulatory Visit
Admission: RE | Admit: 2023-10-06 | Discharge: 2023-10-06 | Disposition: A | Discharge status: Home / Self Care | Payer: Self-pay | Source: Ambulatory Visit | Attending: Cardiology | Admitting: Cardiology

## 2023-10-06 DIAGNOSIS — Z8249 Family history of ischemic heart disease and other diseases of the circulatory system: Secondary | ICD-10-CM | POA: Insufficient documentation
